# Patient Record
Sex: Female | Born: 1990 | Race: Black or African American | Hispanic: No | Marital: Single | Smoking: Current every day smoker
Health system: Southern US, Community
[De-identification: ages and names within clinical notes are randomized; demographics above are authoritative.]

## PROBLEM LIST (undated history)

## (undated) DIAGNOSIS — J45909 Unspecified asthma, uncomplicated: Secondary | ICD-10-CM

## (undated) DIAGNOSIS — J302 Other seasonal allergic rhinitis: Secondary | ICD-10-CM

## (undated) HISTORY — DX: Unspecified asthma, uncomplicated: J45.909

## (undated) HISTORY — DX: Other seasonal allergic rhinitis: J30.2

---
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
## 2008-09-19 ENCOUNTER — Emergency Department: Payer: Self-pay | Admitting: Emergency Medicine

## 2008-12-26 ENCOUNTER — Emergency Department: Payer: Self-pay | Admitting: Emergency Medicine

## 2009-09-26 ENCOUNTER — Emergency Department: Payer: Self-pay | Admitting: Emergency Medicine

## 2010-11-12 ENCOUNTER — Ambulatory Visit: Payer: Self-pay | Admitting: Advanced Practice Midwife

## 2011-02-10 ENCOUNTER — Inpatient Hospital Stay: Payer: Self-pay | Admitting: Obstetrics and Gynecology

## 2011-02-12 ENCOUNTER — Ambulatory Visit: Payer: Self-pay | Admitting: Obstetrics and Gynecology

## 2011-02-18 ENCOUNTER — Observation Stay: Payer: Self-pay

## 2011-03-18 ENCOUNTER — Observation Stay: Payer: Self-pay | Admitting: Obstetrics & Gynecology

## 2011-04-01 ENCOUNTER — Inpatient Hospital Stay: Payer: Self-pay | Admitting: Obstetrics and Gynecology

## 2011-08-03 ENCOUNTER — Emergency Department: Payer: Self-pay | Admitting: Emergency Medicine

## 2011-08-04 ENCOUNTER — Emergency Department: Payer: Self-pay | Admitting: Emergency Medicine

## 2011-11-27 ENCOUNTER — Emergency Department: Payer: Self-pay | Admitting: Emergency Medicine

## 2011-11-27 LAB — COMPREHENSIVE METABOLIC PANEL
Albumin: 3.8 g/dL (ref 3.4–5.0)
Anion Gap: 10 (ref 7–16)
BUN: 8 mg/dL (ref 7–18)
Bilirubin,Total: 0.4 mg/dL (ref 0.2–1.0)
Calcium, Total: 9.5 mg/dL (ref 8.5–10.1)
Chloride: 103 mmol/L (ref 98–107)
Creatinine: 0.6 mg/dL (ref 0.60–1.30)
EGFR (African American): >60
Glucose: 89 mg/dL (ref 65–99)
Osmolality: 270 (ref 275–301)
Potassium: 4.2 mmol/L (ref 3.5–5.1)
SGOT(AST): 20 U/L (ref 15–37)
Total Protein: 8.7 g/dL — ABNORMAL HIGH (ref 6.4–8.2)

## 2011-11-27 LAB — URINALYSIS, COMPLETE
Blood: NEGATIVE
Ketone: NEGATIVE
Ph: 5 (ref 4.5–8.0)
Protein: NEGATIVE
RBC,UR: 1 /HPF (ref 0–5)
Specific Gravity: 1.027 (ref 1.003–1.030)
Squamous Epithelial: 15
WBC UR: 8 /HPF (ref 0–5)

## 2011-11-27 LAB — CBC
HCT: 35.9 % (ref 35.0–47.0)
HGB: 11.9 g/dL — ABNORMAL LOW (ref 12.0–16.0)
Platelet: 323 10*3/uL (ref 150–440)
RBC: 4.12 10*6/uL (ref 3.80–5.20)
RDW: 13.8 % (ref 11.5–14.5)
WBC: 7.5 10*3/uL (ref 3.6–11.0)

## 2011-11-27 LAB — LIPASE, BLOOD: Lipase: 65 U/L — ABNORMAL LOW (ref 73–393)

## 2011-11-27 LAB — PREGNANCY, URINE: Pregnancy Test, Urine: NEGATIVE m[IU]/mL

## 2014-05-05 ENCOUNTER — Emergency Department: Payer: Self-pay | Admitting: Emergency Medicine

## 2014-05-05 LAB — COMPREHENSIVE METABOLIC PANEL
ALK PHOS: 74 U/L
Albumin: 3.8 g/dL (ref 3.4–5.0)
Anion Gap: 4 — ABNORMAL LOW (ref 7–16)
BUN: 11 mg/dL (ref 7–18)
Bilirubin,Total: 0.5 mg/dL (ref 0.2–1.0)
CHLORIDE: 109 mmol/L — AB (ref 98–107)
Calcium, Total: 8.9 mg/dL (ref 8.5–10.1)
Co2: 24 mmol/L (ref 21–32)
Creatinine: 0.81 mg/dL (ref 0.60–1.30)
EGFR (Non-African Amer.): >60
Glucose: 98 mg/dL (ref 65–99)
OSMOLALITY: 273 (ref 275–301)
Potassium: 3.9 mmol/L (ref 3.5–5.1)
SGOT(AST): 15 U/L (ref 15–37)
SGPT (ALT): 20 U/L (ref 12–78)
SODIUM: 137 mmol/L (ref 136–145)
Total Protein: 7.7 g/dL (ref 6.4–8.2)

## 2014-05-05 LAB — URINALYSIS, COMPLETE
BILIRUBIN, UR: NEGATIVE
Bacteria: NONE SEEN
Glucose,UR: NEGATIVE mg/dL (ref 0–75)
Ketone: NEGATIVE
NITRITE: NEGATIVE
PH: 6 (ref 4.5–8.0)
Protein: 30
RBC,UR: 1783 /HPF (ref 0–5)
SPECIFIC GRAVITY: 1.026 (ref 1.003–1.030)

## 2014-05-05 LAB — CBC
HCT: 38.2 % (ref 35.0–47.0)
HGB: 12.4 g/dL (ref 12.0–16.0)
MCH: 29.4 pg (ref 26.0–34.0)
MCHC: 32.6 g/dL (ref 32.0–36.0)
MCV: 90 fL (ref 80–100)
Platelet: 237 10*3/uL (ref 150–440)
RBC: 4.22 10*6/uL (ref 3.80–5.20)
RDW: 14.1 % (ref 11.5–14.5)
WBC: 10.6 10*3/uL (ref 3.6–11.0)

## 2014-05-05 LAB — WET PREP, GENITAL

## 2014-05-05 LAB — PREGNANCY, URINE: PREGNANCY TEST, URINE: NEGATIVE m[IU]/mL

## 2014-05-05 LAB — GC/CHLAMYDIA PROBE AMP

## 2014-10-06 ENCOUNTER — Emergency Department (HOSPITAL_COMMUNITY)
Admission: EM | Admit: 2014-10-06 | Discharge: 2014-10-06 | Disposition: A | Discharge status: Home / Self Care | Payer: Medicaid Other | Attending: Emergency Medicine | Admitting: Emergency Medicine

## 2014-10-06 ENCOUNTER — Encounter (HOSPITAL_COMMUNITY): Payer: Self-pay | Admitting: Emergency Medicine

## 2014-10-06 DIAGNOSIS — N730 Acute parametritis and pelvic cellulitis: Secondary | ICD-10-CM

## 2014-10-06 DIAGNOSIS — Z720 Tobacco use: Secondary | ICD-10-CM | POA: Insufficient documentation

## 2014-10-06 DIAGNOSIS — Z3202 Encounter for pregnancy test, result negative: Secondary | ICD-10-CM | POA: Insufficient documentation

## 2014-10-06 DIAGNOSIS — N739 Female pelvic inflammatory disease, unspecified: Secondary | ICD-10-CM | POA: Diagnosis not present

## 2014-10-06 DIAGNOSIS — R1031 Right lower quadrant pain: Secondary | ICD-10-CM | POA: Diagnosis present

## 2014-10-06 LAB — URINE MICROSCOPIC-ADD ON

## 2014-10-06 LAB — COMPREHENSIVE METABOLIC PANEL
ALK PHOS: 79 U/L (ref 39–117)
ALT: 11 U/L (ref 0–35)
AST: 15 U/L (ref 0–37)
Albumin: 3.1 g/dL — ABNORMAL LOW (ref 3.5–5.2)
Anion gap: 15 (ref 5–15)
BILIRUBIN TOTAL: 0.2 mg/dL — AB (ref 0.3–1.2)
BUN: 10 mg/dL (ref 6–23)
CHLORIDE: 99 meq/L (ref 96–112)
CO2: 22 mEq/L (ref 19–32)
Calcium: 9.7 mg/dL (ref 8.4–10.5)
Creatinine, Ser: 0.64 mg/dL (ref 0.50–1.10)
GFR calc Af Amer: >90 mL/min (ref >90)
GFR calc non Af Amer: >90 mL/min (ref >90)
Glucose, Bld: 96 mg/dL (ref 70–99)
POTASSIUM: 4 meq/L (ref 3.7–5.3)
Sodium: 136 mEq/L — ABNORMAL LOW (ref 137–147)
Total Protein: 8.2 g/dL (ref 6.0–8.3)

## 2014-10-06 LAB — URINALYSIS, ROUTINE W REFLEX MICROSCOPIC
BILIRUBIN URINE: NEGATIVE
Glucose, UA: NEGATIVE mg/dL
Ketones, ur: NEGATIVE mg/dL
Nitrite: NEGATIVE
Protein, ur: NEGATIVE mg/dL
SPECIFIC GRAVITY, URINE: 1.027 (ref 1.005–1.030)
Urobilinogen, UA: 1 mg/dL (ref 0.0–1.0)
pH: 7.5 (ref 5.0–8.0)

## 2014-10-06 LAB — CBC WITH DIFFERENTIAL/PLATELET
Basophils Absolute: 0 10*3/uL (ref 0.0–0.1)
Basophils Relative: 0 % (ref 0–1)
Eosinophils Absolute: 0.2 10*3/uL (ref 0.0–0.7)
Eosinophils Relative: 2 % (ref 0–5)
HCT: 33.1 % — ABNORMAL LOW (ref 36.0–46.0)
HEMOGLOBIN: 10.7 g/dL — AB (ref 12.0–15.0)
LYMPHS PCT: 24 % (ref 12–46)
Lymphs Abs: 3 10*3/uL (ref 0.7–4.0)
MCH: 28.7 pg (ref 26.0–34.0)
MCHC: 32.3 g/dL (ref 30.0–36.0)
MCV: 88.7 fL (ref 78.0–100.0)
MONOS PCT: 6 % (ref 3–12)
Monocytes Absolute: 0.7 10*3/uL (ref 0.1–1.0)
NEUTROS ABS: 8.5 10*3/uL — AB (ref 1.7–7.7)
Neutrophils Relative %: 68 % (ref 43–77)
Platelets: 415 10*3/uL — ABNORMAL HIGH (ref 150–400)
RBC: 3.73 MIL/uL — ABNORMAL LOW (ref 3.87–5.11)
RDW: 13.5 % (ref 11.5–15.5)
WBC: 12.5 10*3/uL — AB (ref 4.0–10.5)

## 2014-10-06 LAB — WET PREP, GENITAL
Clue Cells Wet Prep HPF POC: NONE SEEN
Yeast Wet Prep HPF POC: NONE SEEN

## 2014-10-06 LAB — HIV ANTIBODY (ROUTINE TESTING W REFLEX): HIV 1&2 Ab, 4th Generation: NONREACTIVE

## 2014-10-06 LAB — PREGNANCY, URINE: Preg Test, Ur: NEGATIVE

## 2014-10-06 MED ORDER — LIDOCAINE HCL 2 % IJ SOLN
INTRAMUSCULAR | Status: AC
  Administered 2014-10-06: 400 mg
  Filled 2014-10-06: qty 20

## 2014-10-06 MED ORDER — DOXYCYCLINE HYCLATE 100 MG PO TABS
100.0000 mg | ORAL_TABLET | Freq: Once | ORAL | Status: AC
  Administered 2014-10-06: 100 mg via ORAL
  Filled 2014-10-06: qty 1

## 2014-10-06 MED ORDER — CEFTRIAXONE SODIUM 250 MG IJ SOLR
250.0000 mg | Freq: Once | INTRAMUSCULAR | Status: AC
  Administered 2014-10-06: 250 mg via INTRAMUSCULAR
  Filled 2014-10-06: qty 250

## 2014-10-06 MED ORDER — DOXYCYCLINE HYCLATE 100 MG PO TABS: 100.0000 mg | ORAL_TABLET | Freq: Two times a day (BID) | ORAL | Status: DC

## 2014-10-06 NOTE — Discharge Instructions (Signed)
Pelvic Inflammatory Disease Jasmin Moore, you were seen today for abdominal pain. Take antibiotics as prescribed to clear the infection. Follow-up with a primary care physician within 3 days for continued management. If your symptoms worsen come back to emergency department immediately for repeat evaluation. Thank you. Pelvic inflammatory disease (PID) is an infection in some or all of the female organs. PID can be in the uterus, ovaries, fallopian tubes, or the surrounding tissues inside the lower belly area (pelvis). HOME CARE   If given, take your antibiotic medicine as told. Finish them even if you start to feel better.  Only take medicine as told by your doctor.  Do not have sex (intercourse) until treatment is done or as told by your doctor.  Tell your sex partner if you have PID. Your partner may need to be treated.  Keep all doctor visits. GET HELP RIGHT AWAY IF:   You have a fever.  You have more belly (abdominal) or lower belly pain.  You have chills.  You have pain when you pee (urinate).  You are not better after 72 hours.  You have more fluid (discharge) coming from your vagina or fluid that is not normal.  You need pain medicine from your doctor.  You throw up (vomit).  You cannot take your medicines.  Your partner has a sexually transmitted disease (STD). MAKE SURE YOU:   Understand these instructions.  Will watch your condition.  Will get help right away if you are not doing well or get worse. Document Released: 01/02/2009 Document Revised: 01/31/2013 Document Reviewed: 10/02/2011 [facility] Patient Information 2015 [place], [place]. This information is not intended to replace advice given to you by your health care provider. Make sure you discuss any questions you have with your health care provider.

## 2014-10-06 NOTE — ED Notes (Signed)
Pt states she is having lower abd pain for the past 3 days  Pt states she has been very tired and sleeping a lot  Pt states she has body aches  Pt states it hurts to urinate and she has been having vaginal bleeding off and on   Pt states she wants to be checked for an STD while she is here  Pt states she is unsure when her last normal period was

## 2014-10-06 NOTE — ED Notes (Signed)
Pt complains of lower abdominal pain for several days, she is requesting a pelvic exam, she states that she has constant bleeding throughout the month.
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
## 2014-10-07 LAB — GC/CHLAMYDIA PROBE AMP
CT Probe RNA: NEGATIVE
GC Probe RNA: POSITIVE — AB

## 2014-10-08 ENCOUNTER — Telehealth: Payer: Self-pay | Admitting: Emergency Medicine

## 2014-10-08 NOTE — Telephone Encounter (Signed)
Positive Gonorrhea culture Chart sent to EDP for review 

## 2014-10-10 ENCOUNTER — Telehealth (HOSPITAL_BASED_OUTPATIENT_CLINIC_OR_DEPARTMENT_OTHER): Payer: Self-pay | Admitting: *Deleted

## 2014-12-05 NOTE — ED Notes (Signed)
This Charge RN attempted to call the Pt again w/o a response or the ability to leave a message.  Voice message left for STD RN.

## 2014-12-05 NOTE — ED Notes (Signed)
This Consulting civil engineerCharge RN received a call, from [facility], stating that the Pt was diagnosed w/ gonorrhea.  Sts should have received azithromycin, per CDC protocol.  RN asked that we notify the Pt and provide a prescription.  This Consulting civil engineerCharge RN has tried to call Pt x 4 w/o an answer.  [facility] STD RN- Wendi SnipesMeteea Garner  [phone] Phone                                                                                [phone] Fax

## 2015-01-29 ENCOUNTER — Telehealth (HOSPITAL_COMMUNITY): Payer: Self-pay

## 2015-07-27 ENCOUNTER — Encounter: Payer: Self-pay | Admitting: Emergency Medicine

## 2015-07-27 ENCOUNTER — Emergency Department
Admission: EM | Admit: 2015-07-27 | Discharge: 2015-07-27 | Disposition: A | Discharge status: Home / Self Care | Payer: Medicaid Other | Attending: Emergency Medicine | Admitting: Emergency Medicine

## 2015-07-27 DIAGNOSIS — R300 Dysuria: Secondary | ICD-10-CM | POA: Diagnosis present

## 2015-07-27 DIAGNOSIS — Z720 Tobacco use: Secondary | ICD-10-CM | POA: Insufficient documentation

## 2015-07-27 DIAGNOSIS — Z3202 Encounter for pregnancy test, result negative: Secondary | ICD-10-CM | POA: Insufficient documentation

## 2015-07-27 DIAGNOSIS — N3000 Acute cystitis without hematuria: Secondary | ICD-10-CM | POA: Insufficient documentation

## 2015-07-27 DIAGNOSIS — Z792 Long term (current) use of antibiotics: Secondary | ICD-10-CM | POA: Insufficient documentation

## 2015-07-27 LAB — COMPREHENSIVE METABOLIC PANEL
ALK PHOS: 54 U/L (ref 38–126)
ALT: 11 U/L — ABNORMAL LOW (ref 14–54)
ANION GAP: 5 (ref 5–15)
AST: 16 U/L (ref 15–41)
Albumin: 4.4 g/dL (ref 3.5–5.0)
BUN: 8 mg/dL (ref 6–20)
CALCIUM: 9.3 mg/dL (ref 8.9–10.3)
CO2: 25 mmol/L (ref 22–32)
Chloride: 106 mmol/L (ref 101–111)
Creatinine, Ser: 0.61 mg/dL (ref 0.44–1.00)
GLUCOSE: 88 mg/dL (ref 65–99)
Potassium: 3.6 mmol/L (ref 3.5–5.1)
Sodium: 136 mmol/L (ref 135–145)
TOTAL PROTEIN: 7.6 g/dL (ref 6.5–8.1)
Total Bilirubin: 0.8 mg/dL (ref 0.3–1.2)

## 2015-07-27 LAB — CBC
HCT: 35.2 % (ref 35.0–47.0)
Hemoglobin: 11.7 g/dL — ABNORMAL LOW (ref 12.0–16.0)
MCH: 29.5 pg (ref 26.0–34.0)
MCHC: 33.2 g/dL (ref 32.0–36.0)
MCV: 88.7 fL (ref 80.0–100.0)
Platelets: 304 10*3/uL (ref 150–440)
RBC: 3.96 MIL/uL (ref 3.80–5.20)
RDW: 13.4 % (ref 11.5–14.5)
WBC: 9.7 10*3/uL (ref 3.6–11.0)

## 2015-07-27 LAB — URINALYSIS COMPLETE WITH MICROSCOPIC (ARMC ONLY)
Bilirubin Urine: NEGATIVE
GLUCOSE, UA: NEGATIVE mg/dL
Hgb urine dipstick: NEGATIVE
Ketones, ur: NEGATIVE mg/dL
Nitrite: NEGATIVE
Protein, ur: NEGATIVE mg/dL
SPECIFIC GRAVITY, URINE: 1.015 (ref 1.005–1.030)
Squamous Epithelial / LPF: NONE SEEN
pH: 5 (ref 5.0–8.0)

## 2015-07-27 LAB — POCT PREGNANCY, URINE: PREG TEST UR: NEGATIVE

## 2015-07-27 LAB — LIPASE, BLOOD: Lipase: 19 U/L — ABNORMAL LOW (ref 22–51)

## 2015-07-27 MED ORDER — SULFAMETHOXAZOLE-TRIMETHOPRIM 800-160 MG PO TABS: 1.0000 | ORAL_TABLET | Freq: Two times a day (BID) | ORAL | Status: AC

## 2015-07-27 NOTE — ED Notes (Signed)
Pt reports lower back pain, reports taking vicodin with no relief. Pt reports syncopal episode in shower, reports falling on tail bone, denies LOC or hitting head. Pt also reports dark urine color and decreased urination.

## 2015-07-27 NOTE — ED Notes (Signed)
AAOx3.  Skin warm and dry.  Ambulates with easy and steady gait. NAD 

## 2015-07-27 NOTE — ED Provider Notes (Signed)
[facility] Emergency Department Provider Note ____________________________________________  Time seen: Approximately 9:50 PM  I have reviewed the triage vital signs and the nursing notes.   HISTORY  Chief Complaint Back Pain and Dysuria   HPI Jasmin Moore is a 24 y.o. female who presents to the emergency department for evaluation of back pain x 1 week. She denies syncope when asked directly about the incident in the shower. She states that she just hit her tailbone. She also complains of dark urine and frequent urination that has not been relieved with vicodin.  History reviewed. No pertinent past medical history.  There are no active problems to display for this patient.   History reviewed. No pertinent past surgical history.  Current Outpatient Rx  Name  Moore  Sig  Dispense  Refill  . doxycycline (VIBRA-TABS) 100 MG tablet   Oral   Take 1 tablet (100 mg total) by mouth 2 (two) times daily.   28 tablet   0   . sulfamethoxazole-trimethoprim (BACTRIM DS,SEPTRA DS) 800-160 MG tablet   Oral   Take 1 tablet by mouth 2 (two) times daily.   6 tablet   0     Allergies Review of patient's allergies indicates no known allergies.  Family History  Problem Relation Age of Onset  . Heart failure Mother   . COPD Mother     Social History Social History  Substance Use Topics  . Smoking status: Current Every Day Smoker  . Smokeless tobacco: None  . Alcohol Use: Yes    Review of Systems Constitutional: No fever/chills Cardiovascular: Denies chest pain. Respiratory: Denies shortness of breath or cough. Gastrointestinal: Abdominal pain no., nausea no, vomitingno. Genitourinary: Dysuria no, vaginal discharge no.. Musculoskeletal: Negative for back pain. Skin: Negative for rash. Neurological: Negative for headaches, focal weakness or numbness.  10-point ROS otherwise negative.  ____________________________________________   PHYSICAL  EXAM:  VITAL SIGNS: ED Triage Vitals  Enc Vitals Group     BP 07/27/15 1805 115/68 mmHg     Pulse Rate 07/27/15 1805 96     Resp 07/27/15 1805 16     Temp 07/27/15 1805 98.3 F (36.8 C)     Temp Source 07/27/15 1805 Oral     SpO2 07/27/15 1805 100 %     Weight 07/27/15 1805 110 lb (49.896 kg)     Height 07/27/15 1805  (1.499 m)     Head Cir --      Peak Flow --      Pain Score 07/27/15 1805 10     Pain Loc --      Pain Edu? --      Excl. in GC? --     Constitutional: Alert and oriented. Well appearing and in no acute distress. Eyes: Conjunctivae are normal. PERRL. EOMI. Head: Atraumatic. Nose: No congestion/rhinnorhea. Mouth/Throat: Mucous membranes are moist.  Oropharynx non-erythematous. Neck: No stridor. Cardiovascular: Good peripheral circulation. Respiratory: Normal respiratory effort.  No retractions. Gastrointestinal: Soft and nontender. No distention. No abdominal bruits. Genitourinary: Pelvic exam: Not indicated Musculoskeletal: No extremity tenderness nor edema.  Neurologic:  Normal speech and language. No gross focal neurologic deficits are appreciated. Speech is normal. No gait instability. Skin:  Skin is warm, dry and intact. No rash noted. Psychiatric: Mood and affect are normal. Speech and behavior are normal.  ____________________________________________   LABS (all labs ordered are listed, but only abnormal results are displayed)  Labs Reviewed  LIPASE, BLOOD - Abnormal; Notable for the following:  Lipase 19 (*)    All other components within normal limits  COMPREHENSIVE METABOLIC PANEL - Abnormal; Notable for the following:    ALT 11 (*)    All other components within normal limits  CBC - Abnormal; Notable for the following:    Hemoglobin 11.7 (*)    All other components within normal limits  URINALYSIS COMPLETEWITH MICROSCOPIC ([facility] ONLY) - Abnormal; Notable for the following:    Color, Urine AMBER (*)    APPearance CLEAR (*)     Leukocytes, UA 1+ (*)    Bacteria, UA FEW (*)    All other components within normal limits  POC URINE PREG, ED  POCT PREGNANCY, URINE   ____________________________________________  RADIOLOGY   ____________________________________________   PROCEDURES  Procedure(s) performed: None  ____________________________________________   INITIAL IMPRESSION / ASSESSMENT AND PLAN / ED COURSE  Pertinent labs & imaging results that were available during my care of the patient were reviewed by me and considered in my medical decision making (see chart for details).  Patient was advised to follow up with the primary care provider for symptoms that are not improving over the next 3 days. She was advised to return to the emergency department for symptoms that change or worsen if unable to schedule an appointment with the primary care provider or specialist. ____________________________________________   FINAL CLINICAL IMPRESSION(S) / ED DIAGNOSES  Final diagnoses:  Acute cystitis without hematuria       Chinita Pester, FNP 07/27/15 2158  Minna Antis, MD 07/27/15 2318

## 2015-07-27 NOTE — Discharge Instructions (Signed)

## 2015-12-20 ENCOUNTER — Encounter: Payer: Self-pay | Admitting: *Deleted

## 2015-12-20 ENCOUNTER — Ambulatory Visit
Admission: EM | Admit: 2015-12-20 | Discharge: 2015-12-20 | Disposition: A | Discharge status: Home / Self Care | Payer: Medicaid Other | Attending: Family Medicine | Admitting: Family Medicine

## 2015-12-20 ENCOUNTER — Ambulatory Visit: Payer: Medicaid Other

## 2015-12-20 DIAGNOSIS — M545 Low back pain, unspecified: Secondary | ICD-10-CM

## 2015-12-20 DIAGNOSIS — M62830 Muscle spasm of back: Secondary | ICD-10-CM | POA: Diagnosis not present

## 2015-12-20 LAB — URINALYSIS COMPLETE WITH MICROSCOPIC (ARMC ONLY)
BILIRUBIN URINE: NEGATIVE
Glucose, UA: NEGATIVE mg/dL
HGB URINE DIPSTICK: NEGATIVE
Ketones, ur: NEGATIVE mg/dL
Nitrite: NEGATIVE
PH: 5.5 (ref 5.0–8.0)
PROTEIN: NEGATIVE mg/dL
Specific Gravity, Urine: >1.03 — ABNORMAL HIGH (ref 1.005–1.030)

## 2015-12-20 LAB — PREGNANCY, URINE: Preg Test, Ur: NEGATIVE

## 2015-12-20 MED ORDER — TRAMADOL HCL 50 MG PO TABS: 50.0000 mg | ORAL_TABLET | Freq: Four times a day (QID) | ORAL | Status: AC | PRN

## 2015-12-20 MED ORDER — MELOXICAM 7.5 MG PO TABS: 7.5000 mg | ORAL_TABLET | Freq: Every day | ORAL | Status: AC

## 2015-12-20 MED ORDER — TIZANIDINE HCL 4 MG PO TABS: 4.0000 mg | ORAL_TABLET | Freq: Four times a day (QID) | ORAL | Status: AC | PRN

## 2015-12-20 MED ORDER — KETOROLAC TROMETHAMINE 60 MG/2ML IM SOLN
60.0000 mg | Freq: Once | INTRAMUSCULAR | Status: AC
  Administered 2015-12-20: 60 mg via INTRAMUSCULAR
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
## 2015-12-20 NOTE — Discharge Instructions (Signed)
Back Pain, Adult Back pain is very common. The pain often gets better over time. The cause of back pain is usually not dangerous. Most people can learn to manage their back pain on their own.  HOME CARE  Watch your back pain for any changes. The following actions may help to lessen any pain you are feeling:  Stay active. Start with short walks on flat ground if you can. Try to walk farther each day.  Exercise regularly as told by your doctor. Exercise helps your back heal faster. It also helps avoid future injury by keeping your muscles strong and flexible.  Do not sit, drive, or stand in one place for more than 30 minutes.  Do not stay in bed. Resting more than 1-2 days can slow down your recovery.  Be careful when you bend or lift an object. Use good form when lifting:  Bend at your knees.  Keep the object close to your body.  Do not twist.  Sleep on a firm mattress. Lie on your side, and bend your knees. If you lie on your back, put a pillow under your knees.  Take medicines only as told by your doctor.  Put ice on the injured area.  Put ice in a plastic bag.  Place a towel between your skin and the bag.  Leave the ice on for 20 minutes, 2-3 times a day for the first 2-3 days. After that, you can switch between ice and heat packs.  Avoid feeling anxious or stressed. Find good ways to deal with stress, such as exercise.  Maintain a healthy weight. Extra weight puts stress on your back. GET HELP IF:   You have pain that does not go away with rest or medicine.  You have worsening pain that goes down into your legs or buttocks.  You have pain that does not get better in one week.  You have pain at night.  You lose weight.  You have a fever or chills. GET HELP RIGHT AWAY IF:   You cannot control when you poop (bowel movement) or pee (urinate).  Your arms or legs feel weak.  Your arms or legs lose feeling (numbness).  You feel sick to your stomach (nauseous) or  throw up (vomit).  You have belly (abdominal) pain.  You feel like you may pass out (faint).   This information is not intended to replace advice given to you by your health care provider. Make sure you discuss any questions you have with your health care provider.   Document Released: 03/24/2008 Document Revised: 10/27/2014 Document Reviewed: 02/07/2014 Elsevier Interactive Patient Education 2016 [employer].  Tourist information centre manager After a car crash (motor vehicle collision), it is normal to have bruises and sore muscles. The first 24 hours usually feel the worst. After that, you will likely start to feel better each day. HOME CARE  Put ice on the injured area.  Put ice in a plastic bag.  Place a towel between your skin and the bag.  Leave the ice on for 15-20 minutes, 03-04 times a day.  Drink enough fluids to keep your pee (urine) clear or pale yellow.  Do not drink alcohol.  Take a warm shower or bath 1 or 2 times a day. This helps your sore muscles.  Return to activities as told by your doctor. Be careful when lifting. Lifting can make neck or back pain worse.  Only take medicine as told by your doctor. Do not use aspirin. GET HELP  RIGHT AWAY IF:   Your arms or legs tingle, feel weak, or lose feeling (numbness).  You have headaches that do not get better with medicine.  You have neck pain, especially in the middle of the back of your neck.  You cannot control when you pee (urinate) or poop (bowel movement).  Pain is getting worse in any part of your body.  You are short of breath, dizzy, or pass out (faint).  You have chest pain.  You feel sick to your stomach (nauseous), throw up (vomit), or sweat.  You have belly (abdominal) pain that gets worse.  There is blood in your pee, poop, or throw up.  You have pain in your shoulder (shoulder strap areas).  Your problems are getting worse. MAKE SURE YOU:   Understand these instructions.  Will watch your  condition.  Will get help right away if you are not doing well or get worse.   This information is not intended to replace advice given to you by your health care provider. Make sure you discuss any questions you have with your health care provider.   Document Released: 03/24/2008 Document Revised: 12/29/2011 Document Reviewed: 03/05/2011 Elsevier Interactive Patient Education 2016 [employer]  Muscle Cramps and Spasms Muscle cramps and spasms are when muscles tighten by themselves. They usually get better within minutes. Muscle cramps are painful. They are usually stronger and last longer than muscle spasms. Muscle spasms may or may not be painful. They can last a few seconds or much longer. HOME CARE  Drink enough fluid to keep your pee (urine) clear or pale yellow.  Massage, stretch, and relax the muscle.  Use a warm towel, heating pad, or warm shower water on tight muscles.  Place ice on the muscle if it is tender or in pain.  Put ice in a plastic bag.  Place a towel between your skin and the bag.  Leave the ice on for 15-20 minutes, 03-04 times a day.  Only take medicine as told by your doctor. GET HELP RIGHT AWAY IF:  Your cramps or spasms get worse, happen more often, or do not get better with time. MAKE SURE YOU:  Understand these instructions.  Will watch your condition.  Will get help right away if you are not doing well or get worse.   This information is not intended to replace advice given to you by your health care provider. Make sure you discuss any questions you have with your health care provider.   Document Released: 09/18/2008 Document Revised: 01/31/2013 Document Reviewed: 09/22/2012 Elsevier Interactive Patient Education [employer]

## 2015-12-22 LAB — URINE CULTURE: SPECIAL REQUESTS: NORMAL

## 2016-02-14 ENCOUNTER — Ambulatory Visit: Payer: Medicaid Other | Attending: Physician Assistant | Admitting: Physical Therapy

## 2016-02-18 ENCOUNTER — Ambulatory Visit: Payer: Medicaid Other | Admitting: Physical Therapy

## 2016-02-21 ENCOUNTER — Encounter: Payer: Medicaid Other | Admitting: Physical Therapy

## 2016-02-26 ENCOUNTER — Encounter: Payer: Medicaid Other | Admitting: Physical Therapy

## 2016-02-28 ENCOUNTER — Encounter: Payer: Medicaid Other | Admitting: Physical Therapy

## 2016-03-11 ENCOUNTER — Emergency Department
Admission: EM | Admit: 2016-03-11 | Discharge: 2016-03-11 | Disposition: A | Discharge status: Home / Self Care | Payer: Medicaid Other | Attending: Emergency Medicine | Admitting: Emergency Medicine

## 2016-03-11 DIAGNOSIS — E119 Type 2 diabetes mellitus without complications: Secondary | ICD-10-CM | POA: Diagnosis not present

## 2016-03-11 DIAGNOSIS — F17200 Nicotine dependence, unspecified, uncomplicated: Secondary | ICD-10-CM | POA: Insufficient documentation

## 2016-03-11 DIAGNOSIS — R55 Syncope and collapse: Secondary | ICD-10-CM | POA: Insufficient documentation

## 2016-03-11 LAB — BASIC METABOLIC PANEL
Anion gap: 8 (ref 5–15)
BUN: 20 mg/dL (ref 6–20)
CALCIUM: 9.4 mg/dL (ref 8.9–10.3)
CO2: 23 mmol/L (ref 22–32)
CREATININE: 0.95 mg/dL (ref 0.44–1.00)
Chloride: 108 mmol/L (ref 101–111)
GFR calc Af Amer: >60 mL/min (ref >60)
GLUCOSE: 96 mg/dL (ref 65–99)
POTASSIUM: 3.8 mmol/L (ref 3.5–5.1)
SODIUM: 139 mmol/L (ref 135–145)

## 2016-03-11 LAB — URINALYSIS COMPLETE WITH MICROSCOPIC (ARMC ONLY)
BILIRUBIN URINE: NEGATIVE
Bacteria, UA: NONE SEEN
GLUCOSE, UA: NEGATIVE mg/dL
Hgb urine dipstick: NEGATIVE
KETONES UR: NEGATIVE mg/dL
NITRITE: NEGATIVE
Protein, ur: NEGATIVE mg/dL
Specific Gravity, Urine: 1.017 (ref 1.005–1.030)
Squamous Epithelial / LPF: NONE SEEN
pH: 5 (ref 5.0–8.0)

## 2016-03-11 LAB — CBC
HCT: 37 % (ref 35.0–47.0)
Hemoglobin: 12.5 g/dL (ref 12.0–16.0)
MCH: 29.7 pg (ref 26.0–34.0)
MCHC: 33.8 g/dL (ref 32.0–36.0)
MCV: 87.8 fL (ref 80.0–100.0)
PLATELETS: 301 10*3/uL (ref 150–440)
RBC: 4.21 MIL/uL (ref 3.80–5.20)
RDW: 12.8 % (ref 11.5–14.5)
WBC: 13.7 10*3/uL — AB (ref 3.6–11.0)

## 2016-03-11 LAB — PREGNANCY, URINE: Preg Test, Ur: NEGATIVE

## 2016-03-11 NOTE — ED Notes (Signed)
Pt reports she was at work last night when she started to "see stars and sweat." Pt reports s he has been having low blood pressure issues every other week.

## 2016-03-11 NOTE — ED Notes (Addendum)
Pt in with co dizziness while at work ,states has had cough runny nose for few days.  Pt states "I want everything checked, I want to know if I am a diabetic and check my iron too".  Explained to patient that these are tests done by pmd and we do not do physicals in the ED.

## 2016-03-11 NOTE — Discharge Instructions (Signed)
You have been seen today in the Emergency Department (ED)  for near syncope (almost passing out).  Your workup including labs and EKG show reassuring results.  Your symptoms may be due to dehydration, so it is important that you drink plenty of non-alcoholic fluids. ° °Please call your regular doctor as soon as possible to schedule the next available clinic appointment to follow up with him/her regarding your visit to the ED and your symptoms.  Return to the Emergency Department (ED)  if you have any further syncopal episodes (pass out again) or develop ANY chest pain, pressure, tightness, trouble breathing, sudden sweating, or other symptoms that concern you. ° ° °Near-Syncope °Near-syncope (commonly known as near fainting) is sudden weakness, dizziness, or feeling like you might pass out. During an episode of near-syncope, you may also develop pale skin, have tunnel vision, or feel sick to your stomach (nauseous). Near-syncope may occur when getting up after sitting or while standing for a long time. It is caused by a sudden decrease in blood flow to the brain. This decrease can result from various causes or triggers, most of which are not serious. However, because near-syncope can sometimes be a sign of something serious, a medical evaluation is required. The specific cause is often not determined. °HOME CARE INSTRUCTIONS  °Monitor your condition for any changes. The following actions may help to alleviate any discomfort you are experiencing: °· Have someone stay with you until you feel stable. °· Lie down right away and prop your feet up if you start feeling like you might faint. Breathe deeply and steadily. Wait until all the symptoms have passed. Most of these episodes last only a few minutes. You may feel tired for several hours.   °· Drink enough fluids to keep your urine clear or pale yellow.   °· If you are taking blood pressure or heart medicine, get up slowly when seated or lying down. Take several  minutes to sit and then stand. This can reduce dizziness. °· Follow up with your health care provider as directed.  °SEEK IMMEDIATE MEDICAL CARE IF:  °· You have a severe headache.   °· You have unusual pain in the chest, abdomen, or back.   °· You are bleeding from the mouth or rectum, or you have black or tarry stool.   °· You have an irregular or very fast heartbeat.   °· You have repeated fainting or have seizure-like jerking during an episode.   °· You faint when sitting or lying down.   °· You have confusion.   °· You have difficulty walking.   °· You have severe weakness.   °· You have vision problems.   °MAKE SURE YOU:  °· Understand these instructions. °· Will watch your condition. °· Will get help right away if you are not doing well or get worse. °  °This information is not intended to replace advice given to you by your health care provider. Make sure you discuss any questions you have with your health care provider. °  °Document Released: 10/06/2005 Document Revised: 10/11/2013 Document Reviewed: 03/11/2013 °Elsevier Interactive Patient Education ©2016 Elsevier Inc. ° °

## 2016-03-11 NOTE — ED Provider Notes (Signed)
[facility] Emergency Department Provider Note  ____________________________________________  Time seen: Approximately 6:06 AM  I have reviewed the triage vital signs and the nursing notes.   HISTORY  Chief Complaint Dizziness    HPI Jasmin Moore is a 25 y.o. female who reports a medical history of diabetes although it does not appear in her medical history who presents for a near syncopal episode at work.  She reports that her blood pressure has been intermittently low for several weeks.  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  She reports that she has also had some nasal congestion recently.  She was on the job when she became lightheaded and experienced "seeing some stars".  She thought she might pass out so she lied down on the floor.  She never lost consciousness and did not have a fall or sustain any injuries.  She denies fever/chills, chest pain, shortness of breath, nausea, vomiting, diarrhea, dysuria, vaginal bleeding.  She describes the episode as severe but resolved at this time.  She has been sleeping comfortably in the emergency department.  Her blood pressure is consistently been in the upper 90s to low 100s systolic.  She has a small frame and a normal body habitus.   No past medical history on file.  There are no active problems to display for this patient.   No past surgical history on file.  Current Outpatient Rx  Name  Route  Sig  Dispense  Refill  . doxycycline (VIBRA-TABS) 100 MG tablet   Oral   Take 1 tablet (100 mg total) by mouth 2 (two) times daily. Patient not taking: Reported on 03/11/2016   28 tablet   0   . meloxicam (MOBIC) 7.5 MG tablet   Oral   Take 1 tablet (7.5 mg total) by mouth daily. Not take Motrin or Aleve Patient not taking: Reported on 03/11/2016   30 tablet   0   . sulfamethoxazole-trimethoprim (BACTRIM DS,SEPTRA DS) 800-160 MG tablet   Oral   Take 1 tablet by mouth 2  (two) times daily. Patient not taking: Reported on 03/11/2016   6 tablet   0   . tiZANidine (ZANAFLEX) 4 MG tablet   Oral   Take 1 tablet (4 mg total) by mouth every 6 (six) hours as needed for muscle spasms. Patient not taking: Reported on 03/11/2016   30 tablet   0   . traMADol (ULTRAM) 50 MG tablet   Oral   Take 1 tablet (50 mg total) by mouth every 6 (six) hours as needed. Patient not taking: Reported on 03/11/2016   15 tablet   0     Allergies Review of patient's allergies indicates no known allergies.  Family History  Problem Relation Age of Onset  . Heart failure Mother   . COPD Mother     Social History Social History  Substance Use Topics  . Smoking status: Current Every Day Smoker  . Smokeless tobacco: Not on file  . Alcohol Use: Yes    Review of Systems Constitutional: No fever/chills Eyes: No visual changes. ENT: No sore throat.  Nasal congestion and runny nose Cardiovascular: Denies chest pain. Respiratory: Denies shortness of breath. Gastrointestinal: No abdominal pain.  No nausea, no vomiting.  No diarrhea.  No constipation. Genitourinary: Negative for dysuria. Musculoskeletal: Negative for back pain. Skin: Negative for rash. Neurological: Negative for headaches, focal weakness or numbness.  Near syncope.  10-point ROS  otherwise negative.  ____________________________________________   PHYSICAL EXAM:  VITAL SIGNS: ED Triage Vitals  Enc Vitals Group     BP 03/11/16 0148 100/77 mmHg     Pulse Rate 03/11/16 0148 92     Resp 03/11/16 0148 18     Temp 03/11/16 0148 97.7 F (36.5 C)     Temp Source 03/11/16 0148 Oral     SpO2 03/11/16 0148 97 %     Weight 03/11/16 0148 100 lb (45.36 kg)     Height 03/11/16 0148  (1.499 m)     Head Cir --      Peak Flow --      Pain Score --      Pain Loc --      Pain Edu? --      Excl. in GC? --     Constitutional: Alert and oriented. Well appearing and in no acute distress. Eyes: Conjunctivae  are normal. PERRL. EOMI. Head: Atraumatic. Nose: No congestion/rhinnorhea. Mouth/Throat: Mucous membranes are moist.  Oropharynx non-erythematous. Neck: No stridor.  No meningeal signs.   Cardiovascular: Normal rate, regular rhythm. Good peripheral circulation. Grossly normal heart sounds.   Respiratory: Normal respiratory effort.  No retractions. Lungs CTAB. Gastrointestinal: Soft and nontender. No distention.  Musculoskeletal: No lower extremity tenderness nor edema. No gross deformities of extremities. Neurologic:  Normal speech and language. No gross focal neurologic deficits are appreciated.  Skin:  Skin is warm, dry and intact. No rash noted. Psychiatric: Mood and affect are normal. Speech and behavior are normal.  ____________________________________________   LABS (all labs ordered are listed, but only abnormal results are displayed)  Labs Reviewed  CBC - Abnormal; Notable for the following:    WBC 13.7 (*)    All other components within normal limits  URINALYSIS COMPLETEWITH MICROSCOPIC ([facility] ONLY) - Abnormal; Notable for the following:    Color, Urine YELLOW (*)    APPearance CLEAR (*)    Leukocytes, UA TRACE (*)    All other components within normal limits  BASIC METABOLIC PANEL  PREGNANCY, URINE   ____________________________________________  EKG  ED ECG REPORT I, Tylar Merendino, the attending physician, personally viewed and interpreted this ECG.  Date: 03/11/2016 EKG Time: 06:55 Rate: 84 Rhythm: normal sinus rhythm QRS Axis: normal Intervals: normal ST/T Wave abnormalities: Non-specific ST segment / T-wave changes, but no evidence of acute ischemia. Conduction Disturbances: none Narrative Interpretation: unremarkable  ____________________________________________  RADIOLOGY   No results found.  ____________________________________________   PROCEDURES  Procedure(s) performed: None  Critical Care performed:  No ____________________________________________   INITIAL IMPRESSION / ASSESSMENT AND PLAN / ED COURSE  Pertinent labs & imaging results that were available during my care of the patient were reviewed by me and considered in my medical decision making (see chart for details).  Physical exam is reassuring as are the vital signs.  EKG is unremarkable and blood work is normal.  I gave the patient the reassuring results.  I encouraged her to drink plenty of by mouth fluids as she might be slightly volume depleted.  There is no indication for IV fluids at this point; she is able to tolerate by mouth intake just fine and is asymptomatic this point.  I encouraged her to follow up as an outpatient.  She understands and agrees with the plan.   I gave my usual and customary return precautions.      ____________________________________________  FINAL CLINICAL IMPRESSION(S) / ED DIAGNOSES  Final diagnoses:  Near syncope  MEDICATIONS GIVEN DURING THIS VISIT:  Medications - No data to display   NEW OUTPATIENT MEDICATIONS STARTED DURING THIS VISIT:  New Prescriptions   No medications on file      Note:  This document was prepared using Dragon voice recognition software and may include unintentional dictation errors.   Loleta Roseory Daneen Volcy, MD 03/11/16 [phone]

## 2017-08-14 ENCOUNTER — Encounter: Payer: Self-pay | Admitting: Emergency Medicine

## 2017-08-14 ENCOUNTER — Emergency Department
Admission: EM | Admit: 2017-08-14 | Discharge: 2017-08-14 | Disposition: A | Discharge status: Home / Self Care | Payer: Medicaid Other | Attending: Emergency Medicine | Admitting: Emergency Medicine

## 2017-08-14 DIAGNOSIS — Y9389 Activity, other specified: Secondary | ICD-10-CM | POA: Insufficient documentation

## 2017-08-14 DIAGNOSIS — Y999 Unspecified external cause status: Secondary | ICD-10-CM | POA: Insufficient documentation

## 2017-08-14 DIAGNOSIS — F10920 Alcohol use, unspecified with intoxication, uncomplicated: Secondary | ICD-10-CM | POA: Diagnosis not present

## 2017-08-14 DIAGNOSIS — F17210 Nicotine dependence, cigarettes, uncomplicated: Secondary | ICD-10-CM | POA: Diagnosis not present

## 2017-08-14 DIAGNOSIS — Z23 Encounter for immunization: Secondary | ICD-10-CM | POA: Diagnosis not present

## 2017-08-14 DIAGNOSIS — S0181XA Laceration without foreign body of other part of head, initial encounter: Secondary | ICD-10-CM | POA: Diagnosis present

## 2017-08-14 DIAGNOSIS — Y9289 Other specified places as the place of occurrence of the external cause: Secondary | ICD-10-CM | POA: Insufficient documentation

## 2017-08-14 DIAGNOSIS — Z79899 Other long term (current) drug therapy: Secondary | ICD-10-CM | POA: Insufficient documentation

## 2017-08-14 LAB — CBC WITH DIFFERENTIAL/PLATELET
Basophils Absolute: 0.1 10*3/uL (ref 0–0.1)
Basophils Relative: 1 %
EOS PCT: 1 %
Eosinophils Absolute: 0.1 10*3/uL (ref 0–0.7)
HEMATOCRIT: 36.4 % (ref 35.0–47.0)
HEMOGLOBIN: 12.4 g/dL (ref 12.0–16.0)
LYMPHS ABS: 2 10*3/uL (ref 1.0–3.6)
LYMPHS PCT: 20 %
MCH: 30.3 pg (ref 26.0–34.0)
MCHC: 34.2 g/dL (ref 32.0–36.0)
MCV: 88.5 fL (ref 80.0–100.0)
MONO ABS: 0.3 10*3/uL (ref 0.2–0.9)
MONOS PCT: 3 %
Neutro Abs: 7.4 10*3/uL — ABNORMAL HIGH (ref 1.4–6.5)
Neutrophils Relative %: 75 %
Platelets: 286 10*3/uL (ref 150–440)
RBC: 4.11 MIL/uL (ref 3.80–5.20)
RDW: 13.5 % (ref 11.5–14.5)
WBC: 9.9 10*3/uL (ref 3.6–11.0)

## 2017-08-14 LAB — BASIC METABOLIC PANEL
Anion gap: 9 (ref 5–15)
BUN: 13 mg/dL (ref 6–20)
CHLORIDE: 106 mmol/L (ref 101–111)
CO2: 21 mmol/L — AB (ref 22–32)
Calcium: 9 mg/dL (ref 8.9–10.3)
Creatinine, Ser: 0.73 mg/dL (ref 0.44–1.00)
GFR calc Af Amer: >60 mL/min (ref >60)
GFR calc non Af Amer: >60 mL/min (ref >60)
Glucose, Bld: 96 mg/dL (ref 65–99)
POTASSIUM: 3.8 mmol/L (ref 3.5–5.1)
SODIUM: 136 mmol/L (ref 135–145)

## 2017-08-14 LAB — ETHANOL: ALCOHOL ETHYL (B): 83 mg/dL — AB (ref <10)

## 2017-08-14 MED ORDER — SODIUM CHLORIDE 0.9 % IV BOLUS (SEPSIS)
1000.0000 mL | Freq: Once | INTRAVENOUS | Status: AC
  Administered 2017-08-14: 1000 mL via INTRAVENOUS

## 2017-08-14 MED ORDER — CEPHALEXIN 500 MG PO CAPS: 500.0000 mg | ORAL_CAPSULE | Freq: Three times a day (TID) | ORAL | 0 refills | Status: AC

## 2017-08-14 MED ORDER — CEFAZOLIN SODIUM-DEXTROSE 1-4 GM/50ML-% IV SOLN
1.0000 g | Freq: Once | INTRAVENOUS | Status: AC
  Administered 2017-08-14: 1 g via INTRAVENOUS
  Filled 2017-08-14: qty 50

## 2017-08-14 MED ORDER — TETANUS-DIPHTH-ACELL PERTUSSIS 5-2.5-18.5 LF-MCG/0.5 IM SUSP
0.5000 mL | Freq: Once | INTRAMUSCULAR | Status: AC
  Administered 2017-08-14: 0.5 mL via INTRAMUSCULAR
  Filled 2017-08-14: qty 0.5

## 2017-08-14 MED ORDER — LIDOCAINE HCL (PF) 1 % IJ SOLN
INTRAMUSCULAR | Status: AC
  Filled 2017-08-14: qty 5
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
## 2017-08-14 NOTE — Discharge Instructions (Addendum)
1. Suture removal in 5 days. 2. Take antibiotic as prescribed (Keflex 500 mg 3 times daily 7 days). 3. Your tetanus has been updated and will be good for the next 10 years. 4. Return to the ER for worsening symptoms, increased redness/swelling, purulent discharge, persistent vomiting, difficulty breathing or other concerns.
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
## 2019-08-08 ENCOUNTER — Other Ambulatory Visit: Payer: Self-pay

## 2019-08-08 ENCOUNTER — Encounter: Payer: Self-pay | Admitting: Emergency Medicine

## 2019-08-08 DIAGNOSIS — N739 Female pelvic inflammatory disease, unspecified: Secondary | ICD-10-CM | POA: Insufficient documentation

## 2019-08-08 DIAGNOSIS — F17200 Nicotine dependence, unspecified, uncomplicated: Secondary | ICD-10-CM | POA: Diagnosis not present

## 2019-08-08 DIAGNOSIS — R102 Pelvic and perineal pain: Secondary | ICD-10-CM | POA: Insufficient documentation

## 2019-08-08 DIAGNOSIS — R1030 Lower abdominal pain, unspecified: Secondary | ICD-10-CM | POA: Diagnosis present

## 2019-08-08 NOTE — ED Triage Notes (Signed)
Patient ambulatory to triage with steady gait, without difficulty or distress noted, mask in place; pt reports x 2 days having generalized abd pain "like contractions" with no accomp symptoms; denies hx of same

## 2019-08-09 ENCOUNTER — Encounter: Payer: Self-pay | Admitting: Radiology

## 2019-08-09 ENCOUNTER — Emergency Department
Admission: EM | Admit: 2019-08-09 | Discharge: 2019-08-09 | Disposition: A | Discharge status: Home / Self Care | Payer: Medicaid Other | Attending: Emergency Medicine | Admitting: Emergency Medicine

## 2019-08-09 ENCOUNTER — Emergency Department: Payer: Medicaid Other

## 2019-08-09 DIAGNOSIS — N730 Acute parametritis and pelvic cellulitis: Secondary | ICD-10-CM

## 2019-08-09 DIAGNOSIS — R102 Pelvic and perineal pain: Secondary | ICD-10-CM

## 2019-08-09 DIAGNOSIS — R1030 Lower abdominal pain, unspecified: Secondary | ICD-10-CM

## 2019-08-09 LAB — URINALYSIS, COMPLETE (UACMP) WITH MICROSCOPIC
Bacteria, UA: NONE SEEN
Bilirubin Urine: NEGATIVE
Glucose, UA: NEGATIVE mg/dL
Hgb urine dipstick: NEGATIVE
Ketones, ur: 20 mg/dL — AB
Nitrite: NEGATIVE
Protein, ur: NEGATIVE mg/dL
Specific Gravity, Urine: 1.029 (ref 1.005–1.030)
pH: 6 (ref 5.0–8.0)

## 2019-08-09 LAB — WET PREP, GENITAL
Clue Cells Wet Prep HPF POC: NONE SEEN
Sperm: NONE SEEN
Trich, Wet Prep: NONE SEEN
Yeast Wet Prep HPF POC: NONE SEEN

## 2019-08-09 LAB — COMPREHENSIVE METABOLIC PANEL
ALT: 18 U/L (ref 0–44)
AST: 19 U/L (ref 15–41)
Albumin: 4.2 g/dL (ref 3.5–5.0)
Alkaline Phosphatase: 74 U/L (ref 38–126)
Anion gap: 8 (ref 5–15)
BUN: 14 mg/dL (ref 6–20)
CO2: 23 mmol/L (ref 22–32)
Calcium: 9.4 mg/dL (ref 8.9–10.3)
Chloride: 106 mmol/L (ref 98–111)
Creatinine, Ser: 0.73 mg/dL (ref 0.44–1.00)
GFR calc Af Amer: >60 mL/min (ref >60)
GFR calc non Af Amer: >60 mL/min (ref >60)
Glucose, Bld: 117 mg/dL — ABNORMAL HIGH (ref 70–99)
Potassium: 4 mmol/L (ref 3.5–5.1)
Sodium: 137 mmol/L (ref 135–145)
Total Bilirubin: 0.4 mg/dL (ref 0.3–1.2)
Total Protein: 8.1 g/dL (ref 6.5–8.1)

## 2019-08-09 LAB — CBC WITH DIFFERENTIAL/PLATELET
Abs Immature Granulocytes: 0.1 10*3/uL — ABNORMAL HIGH (ref 0.00–0.07)
Basophils Absolute: 0.1 10*3/uL (ref 0.0–0.1)
Basophils Relative: 0 %
Eosinophils Absolute: 0.4 10*3/uL (ref 0.0–0.5)
Eosinophils Relative: 3 %
HCT: 37.1 % (ref 36.0–46.0)
Hemoglobin: 12 g/dL (ref 12.0–15.0)
Immature Granulocytes: 1 %
Lymphocytes Relative: 19 %
Lymphs Abs: 2.7 10*3/uL (ref 0.7–4.0)
MCH: 29.1 pg (ref 26.0–34.0)
MCHC: 32.3 g/dL (ref 30.0–36.0)
MCV: 90 fL (ref 80.0–100.0)
Monocytes Absolute: 0.8 10*3/uL (ref 0.1–1.0)
Monocytes Relative: 6 %
Neutro Abs: 10.1 10*3/uL — ABNORMAL HIGH (ref 1.7–7.7)
Neutrophils Relative %: 71 %
Platelets: 361 10*3/uL (ref 150–400)
RBC: 4.12 MIL/uL (ref 3.87–5.11)
RDW: 13.6 % (ref 11.5–15.5)
WBC: 14.2 10*3/uL — ABNORMAL HIGH (ref 4.0–10.5)
nRBC: 0 % (ref 0.0–0.2)

## 2019-08-09 LAB — POCT PREGNANCY, URINE: Preg Test, Ur: NEGATIVE

## 2019-08-09 LAB — HCG, QUANTITATIVE, PREGNANCY: hCG, Beta Chain, Quant, S: <1 m[IU]/mL (ref <5)

## 2019-08-09 LAB — LIPASE, BLOOD: Lipase: 16 U/L (ref 11–51)

## 2019-08-09 MED ORDER — DOXYCYCLINE HYCLATE 100 MG PO TABS
100.0000 mg | ORAL_TABLET | Freq: Once | ORAL | Status: AC
  Administered 2019-08-09: 100 mg via ORAL
  Filled 2019-08-09: qty 1

## 2019-08-09 MED ORDER — IOHEXOL 300 MG/ML  SOLN
100.0000 mL | Freq: Once | INTRAMUSCULAR | Status: AC | PRN
  Administered 2019-08-09: 100 mL via INTRAVENOUS

## 2019-08-09 MED ORDER — KETOROLAC TROMETHAMINE 30 MG/ML IJ SOLN
15.0000 mg | Freq: Once | INTRAMUSCULAR | Status: AC
  Administered 2019-08-09: 15 mg via INTRAVENOUS
  Filled 2019-08-09: qty 1

## 2019-08-09 MED ORDER — DEXTROSE 5 % IV SOLN
250.0000 mg | Freq: Once | INTRAVENOUS | Status: AC
  Administered 2019-08-09: 250 mg via INTRAVENOUS
  Filled 2019-08-09: qty 250

## 2019-08-09 MED ORDER — DOXYCYCLINE HYCLATE 100 MG PO CAPS: 100.0000 mg | ORAL_CAPSULE | Freq: Two times a day (BID) | ORAL | 0 refills | Status: AC

## 2019-08-09 NOTE — ED Notes (Signed)
Pt awaiting CT scan.
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
## 2019-08-09 NOTE — ED Notes (Signed)
Pt back from ultrasound.

## 2019-08-09 NOTE — ED Notes (Signed)
Pt in with generalized abd pain for 2-3 days. States has not had normal BM in over a week, denies hx of constipation. Pt also states has had decreased urination. PT denies any n.v.d or fever. Pt states it burns when she voids at times, states also concerned about STD. States has noted a fishy smell, denies any discharge.

## 2019-08-10 LAB — URINE CULTURE

## 2019-08-13 LAB — GC/CHLAMYDIA PROBE AMP
Chlamydia trachomatis, NAA: NEGATIVE
Neisseria Gonorrhoeae by PCR: POSITIVE — AB

## 2020-06-06 ENCOUNTER — Encounter: Payer: Self-pay | Admitting: Physician Assistant

## 2020-06-06 ENCOUNTER — Other Ambulatory Visit: Payer: Self-pay

## 2020-06-06 ENCOUNTER — Ambulatory Visit (LOCAL_COMMUNITY_HEALTH_CENTER): Payer: Medicaid Other | Admitting: Physician Assistant

## 2020-06-06 VITALS — BP 130/83 | Ht <= 58 in | Wt 139.0 lb

## 2020-06-06 DIAGNOSIS — Z309 Encounter for contraceptive management, unspecified: Secondary | ICD-10-CM

## 2020-06-06 DIAGNOSIS — Z3009 Encounter for other general counseling and advice on contraception: Secondary | ICD-10-CM

## 2020-06-06 DIAGNOSIS — Z113 Encounter for screening for infections with a predominantly sexual mode of transmission: Secondary | ICD-10-CM

## 2020-06-06 DIAGNOSIS — Z01419 Encounter for gynecological examination (general) (routine) without abnormal findings: Secondary | ICD-10-CM | POA: Diagnosis not present

## 2020-06-06 LAB — WET PREP FOR TRICH, YEAST, CLUE
Trichomonas Exam: NEGATIVE
Yeast Exam: NEGATIVE

## 2020-06-06 MED ORDER — THERA VITAL M PO TABS: 1.0000 | ORAL_TABLET | Freq: Every day | ORAL | 0 refills | Status: AC

## 2020-06-06 NOTE — Progress Notes (Signed)
Wet mount reviewed and is negative today, so no treatment needed for wet mount per standing order. Pt received MVI's per pt request and per provider verbal order. Pt also received information pamphlet on iron rich foods per provider verbal order. Pt received Kathreen Cosier, LCSW, and [employer] cards with contact info per provider order. Nexplanon consult completed and pt scheduled to RTC on 06/14/2020 at 9:40am for Nexplanon insertion per pt request and per provider order. Pt aware of appt date and time and to arrive early for check-in and appt card given to pt. Pt aware that from now until coming in for appt to either not have sex or use condoms every time. Pt states understanding and states she plans to abstain from sex until coming in for appt. Counseled pt per provider orders and pt states understanding. Provider orders completed.
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
## 2020-06-07 ENCOUNTER — Encounter: Payer: Self-pay | Admitting: Physician Assistant

## 2020-06-07 NOTE — Progress Notes (Signed)
[facility] DEPARTMENT [facility] [address] Main Number: [phone]    Family Planning Visit- Initial Visit  Subjective:  Jasmin Moore is a 29 y.o.  G1P1   being seen today for an initial well woman visit and to discuss family planning options.  She is currently using None for pregnancy prevention. Patient reports she does not want a pregnancy in the next year.  Patient has the following medical conditions does not have a problem list on file.  Chief Complaint  Patient presents with  . Contraception    Physical, pap and birth control    Patient reports that she would like to get on birth control but not the Depo since she was on that previously and caused "things to go wrong with my body".  States that the has a history of asthma and depression and had been on medicines but has not seen her PCP to get refills.  States that she is having breast tenderness around the time she has her period.  Reports cramping after sex last week that has now resolved.  Headaches are bitemporal and frontal, denies aura, vision changes and neuro symptoms.  States that she uses Advil and Excedrin to help relieve.  PHQ-9=12, without suicidal or homicidal ideation or plan.  Reports that her PCP referred her to a counselor but due to transportation issues she was not able to keep some of her appointments.  States that she now has reliable transportation so plans to follow up and get an appointment with the counselor again and back on her depression medicines. Thinks that she would like to get a Nexplanon as her BCM.    Patient denies any further concerns today.    Body mass index is 29.05 kg/m. - Patient is eligible for diabetes screening based on BMI and age >74?  not applicable HA1C ordered? not applicable  Patient reports 2 of partners in last year. Desires STI screening?  Yes  Has patient been screened once for HCV in the past?  No  No results found for:  HCVAB  Does the patient have current drug use (including MJ), have a partner with drug use, and/or has been incarcerated since last result? No  If yes-- Screen for HCV through [facility]   Does the patient meet criteria for HBV testing? No  Criteria:  -Household, sexual or needle sharing contact with HBV -History of drug use -HIV positive -Those with known Hep C   Health Maintenance Due  Topic Date Due  . Hepatitis C Screening  Never done  . COVID-19 Vaccine (1) Never done  . PAP SMEAR-Modifier  Never done  . INFLUENZA VACCINE  05/20/2020    Review of Systems  All other systems reviewed and are negative.   The following portions of the patient's history were reviewed and updated as appropriate: allergies, current medications, past family history, past medical history, past social history, past surgical history and problem list. Problem list updated.   See flowsheet for other program required questions.  Objective:   Vitals:   06/06/20 0928  BP: 130/83  Weight: 139 lb (63 kg)  Height: 4\' 10"  (1.473 m)    Physical Exam Vitals and nursing note reviewed.  Constitutional:      General: She is not in acute distress.    Appearance: Normal appearance.  HENT:     Head: Normocephalic and atraumatic.  Eyes:     Conjunctiva/sclera: Conjunctivae normal.  Neck:  Thyroid: No thyroid mass, thyromegaly or thyroid tenderness.  Cardiovascular:     Rate and Rhythm: Normal rate and regular rhythm.  Pulmonary:     Effort: Pulmonary effort is normal.     Breath sounds: Normal breath sounds.  Chest:     Breasts:        Right: Normal. No mass, nipple discharge, skin change or tenderness.        Left: Normal. No mass, nipple discharge, skin change or tenderness.  Abdominal:     Palpations: Abdomen is soft. There is no mass.     Tenderness: There is no abdominal tenderness. There is no guarding or rebound.  Genitourinary:    General: Normal vulva.     Rectum: Normal.      Comments: External genitalia/pubic area without nits, lice, edema, erythema, lesions and inguinal adenopathy. Vagina with normal mucosa and discharge, Cervix without visible lesions. Uterus firm, mobile, nt, no masses, no CMT, no adnexal tenderness or fullness. Musculoskeletal:     Cervical back: Neck supple. No tenderness.  Lymphadenopathy:     Cervical: No cervical adenopathy.     Upper Body:     Right upper body: No supraclavicular, axillary or pectoral adenopathy.     Left upper body: No supraclavicular, axillary or pectoral adenopathy.  Skin:    General: Skin is warm and dry.     Findings: No bruising, erythema, lesion or rash.  Neurological:     Mental Status: She is alert and oriented to person, place, and time.  Psychiatric:        Mood and Affect: Mood normal.        Behavior: Behavior normal.        Thought Content: Thought content normal.        Judgment: Judgment normal.       Assessment and Plan:  Jasmin Moore is a 29 y.o. female presenting to the [facility] Department for an initial well woman exam/family planning visit  Contraception counseling: Reviewed all forms of birth control options in the tiered based approach. available including abstinence; over the counter/barrier methods; hormonal contraceptive medication including pill, patch, ring, injection,contraceptive implant, ECP; hormonal and nonhormonal IUDs; permanent sterilization options including vasectomy and the various tubal sterilization modalities. Risks, benefits, and typical effectiveness rates were reviewed.  Questions were answered.  Written information was also given to the patient to review.  Patient desires Nexplanon insertion.Marland Kitchen She will follow up in  1 week for insertion, 1 year for RP and prn for surveillance.  She was told to call with any further questions, or with any concerns about this method of contraception.  Emphasized use of condoms 100% of the time for STI  prevention.  Patient was not a candidate for ECP today.   1. Encounter for counseling regarding contraception Counseled re:  Above for BCMs and specifically re:  Nexplanon. Counseled patient that due to unprotected sex last week we are not able to do insertion today, but that we can schedule for insertion in about 1 week. Rec to abstain or use condoms with all sex until 10 days after insertion. Enc MVI 1 po daily.  - Multiple Vitamins-Minerals (MULTIVITAMIN) tablet; Take 1 tablet by mouth daily.  Dispense: 100 tablet; Refill: 0  2. Screening for STD (sexually transmitted disease) Await test results.  Counseled that RN will call if needs to RTC for treatment once results are back.  - WET PREP FOR TRICH, YEAST, CLUE - Chlamydia/Gonorrhea [facility] - Syphilis  Serology, [facility] - HIV/HCV  Lab  3. Well woman exam with routine gynecological exam Reviewed with patient healthy habits to maintain normal BMI and general health. Enc to follow up with PCP for chronic conditions and medications for asthma and depression and evaluation for headaches. Await pap results and counseled that RN will send letter or call with results and follow up plan.  Offer LCSW and [employer] cards for depression if not able to get in touch with previous counselor. - IGP, [employer] HPV     Return in 8 days (on 06/14/2020) for Nexplanon insertion, 2-3 weeks for TR's, annual and PRN.  Future Appointments  Date Time Provider Department Center  06/14/2020  9:40 AM [facility] PROVIDER [facility] None    Matt Holmes, PA

## 2020-06-09 LAB — IGP, APTIMA HPV
HPV Aptima: NEGATIVE
PAP Smear Comment: 0

## 2020-06-11 ENCOUNTER — Encounter: Payer: Self-pay | Admitting: Family Medicine

## 2020-06-11 LAB — HM HIV SCREENING LAB: HM HIV Screening: NEGATIVE

## 2020-06-11 LAB — HM HEPATITIS C SCREENING LAB: HM Hepatitis Screen: NEGATIVE

## 2020-06-14 ENCOUNTER — Ambulatory Visit: Payer: Self-pay

## 2020-08-21 ENCOUNTER — Telehealth: Payer: Self-pay | Admitting: Family Medicine

## 2020-08-21 NOTE — Telephone Encounter (Signed)
Call to patient, receiving message " this number can not be completed as dialed."   Harvie Heck, RN

## 2020-08-21 NOTE — Telephone Encounter (Signed)
Pt wants results from last visit.

## 2020-12-31 ENCOUNTER — Other Ambulatory Visit: Payer: Medicaid Other

## 2021-03-22 ENCOUNTER — Other Ambulatory Visit: Payer: Medicaid Other

## 2021-07-26 IMAGING — US US PELVIS COMPLETE TRANSABD/TRANSVAG W DUPLEX
1 series · 13 of 25 positions shown · non-contrast
Comparison: Pelvic ultrasound 05/05/2014

CLINICAL DATA: Suprapubic abdominal pain.  Pain for 2 days.

EXAM:
TRANSABDOMINAL AND TRANSVAGINAL ULTRASOUND OF PELVIS
DOPPLER ULTRASOUND OF OVARIES
TECHNIQUE: Both transabdominal and transvaginal ultrasound examinations of the
pelvis were performed. Transabdominal technique was performed for
global imaging of the pelvis including uterus, ovaries, adnexal
regions, and pelvic cul-de-sac.
It was necessary to proceed with endovaginal exam following the
transabdominal exam to visualize the uterus and adnexa. Color and
duplex Doppler ultrasound was utilized to evaluate blood flow to the
ovaries.

[Series 1: us pelvis complete transabd/transvag w duplex · 13 of 96 slices shown]
[im 1/96]
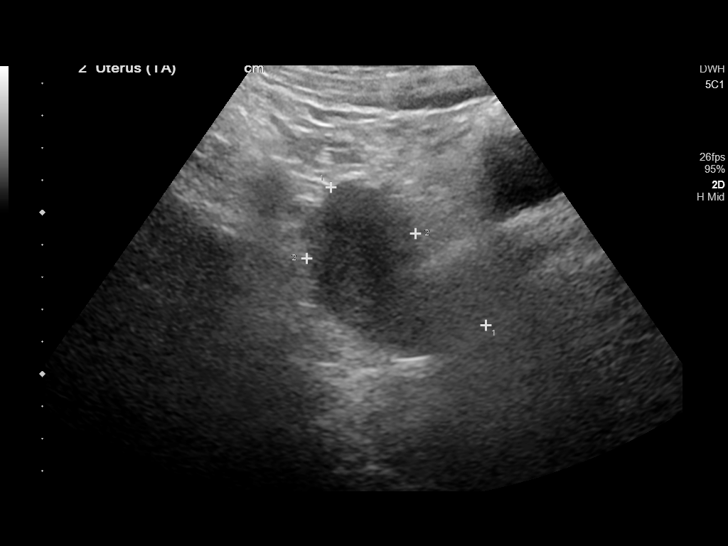
[im 8/96]
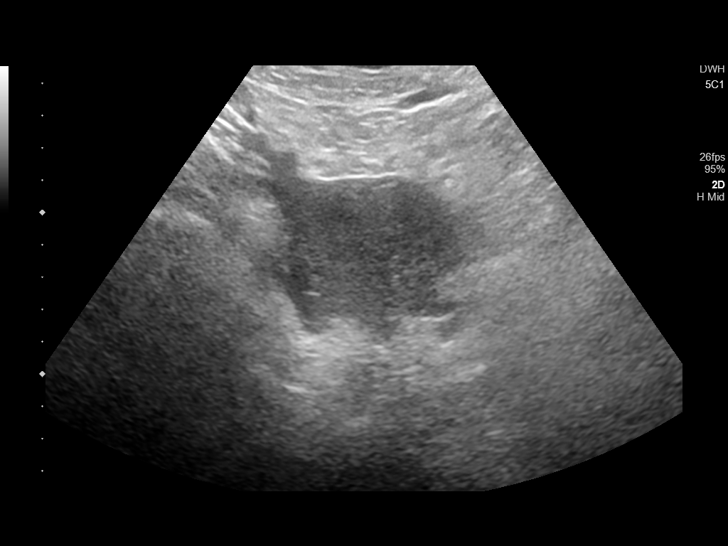
[im 16/96]
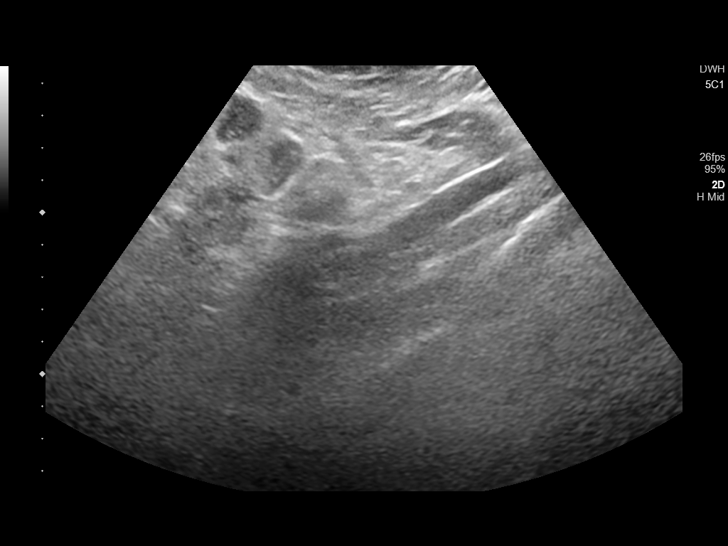
[im 24/96]
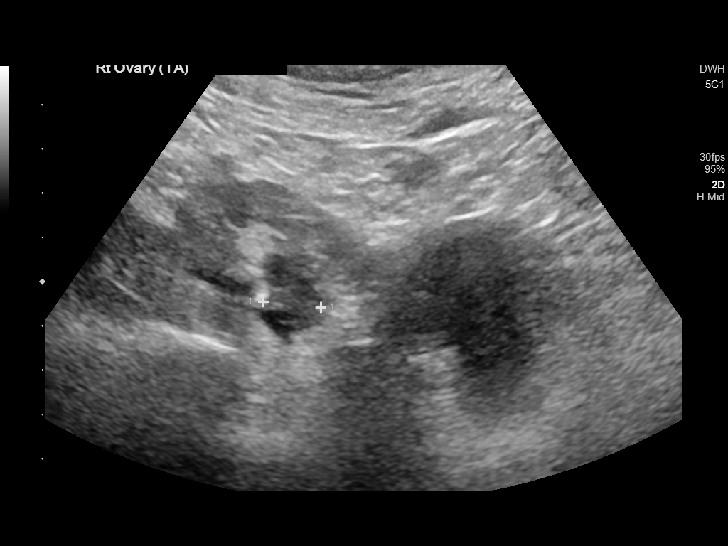
[im 32/96]
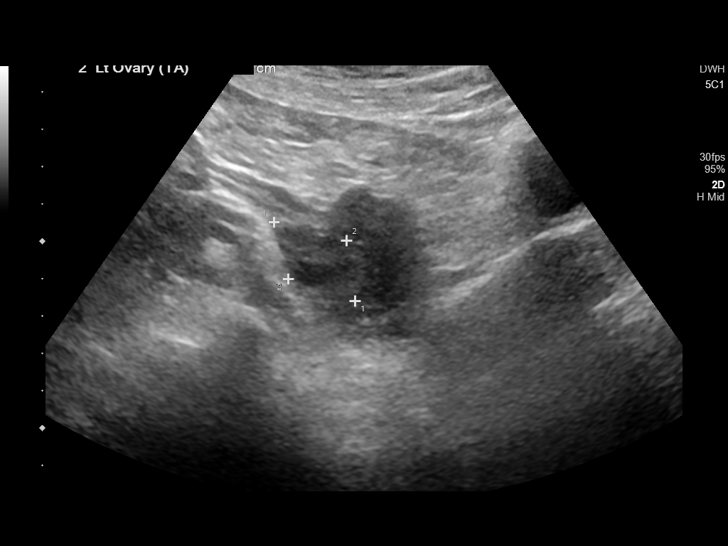
[im 40/96]
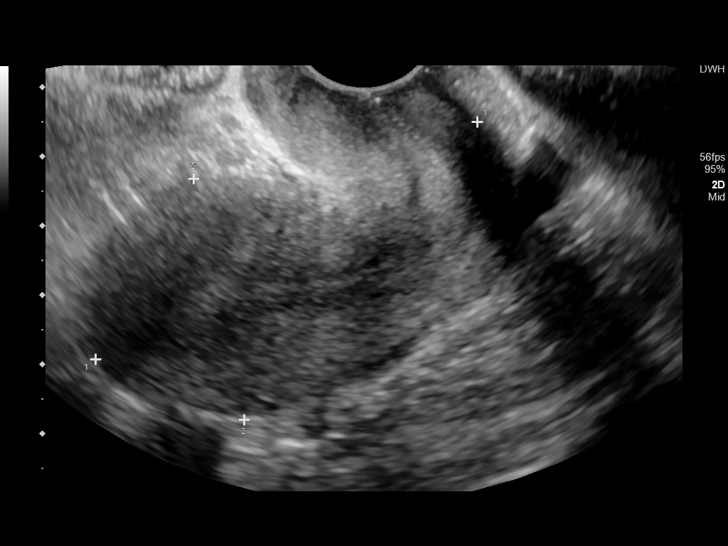
[im 48/96]
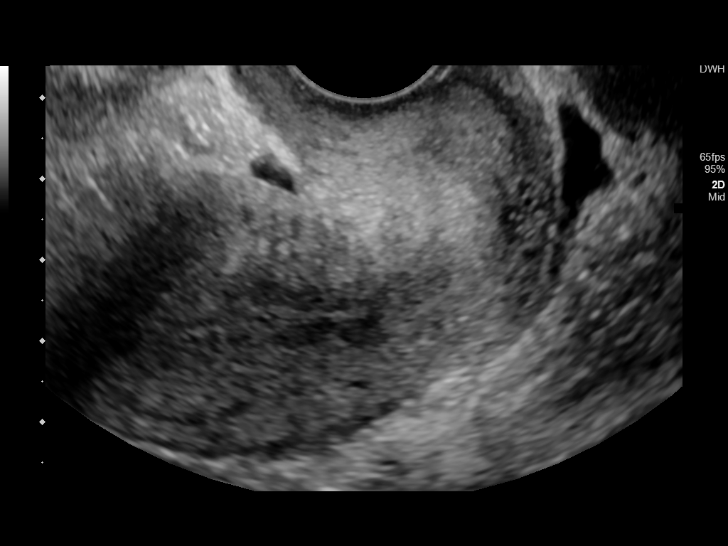
[im 56/96]
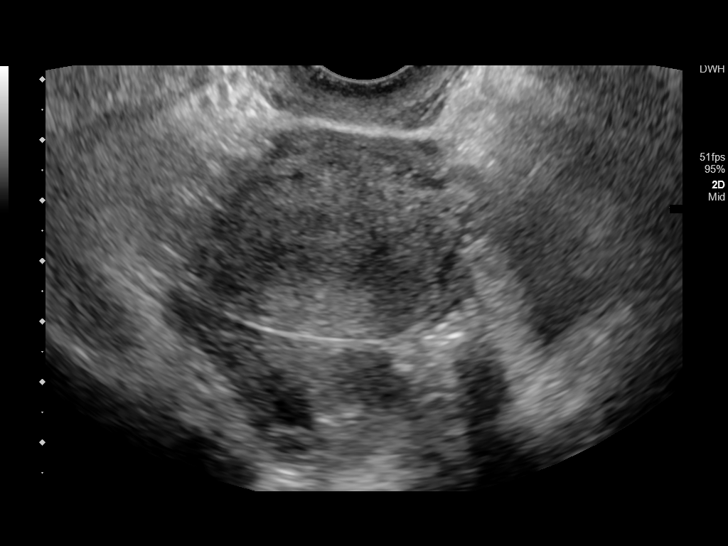
[im 64/96]
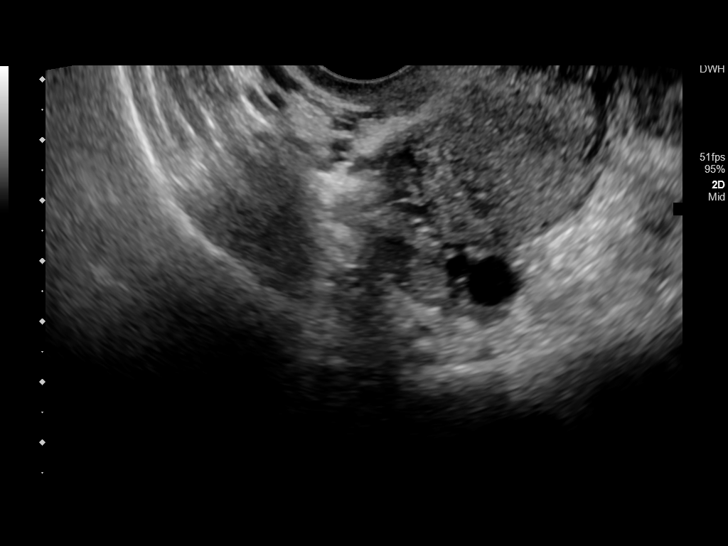
[im 72/96]
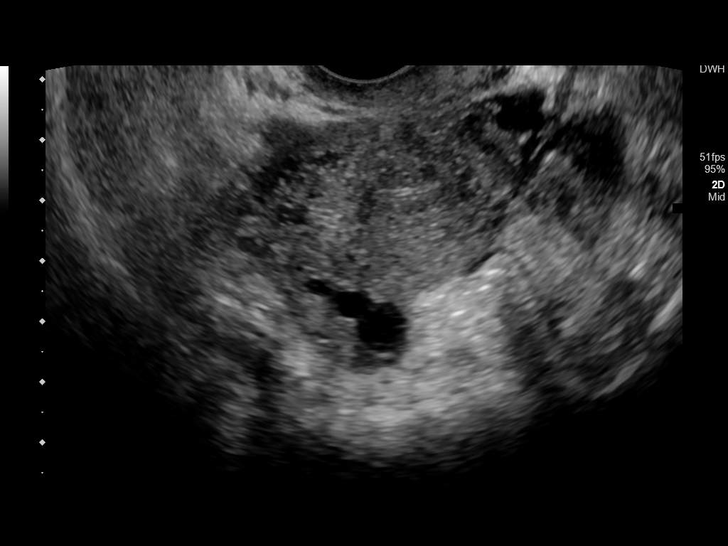
[im 80/96]
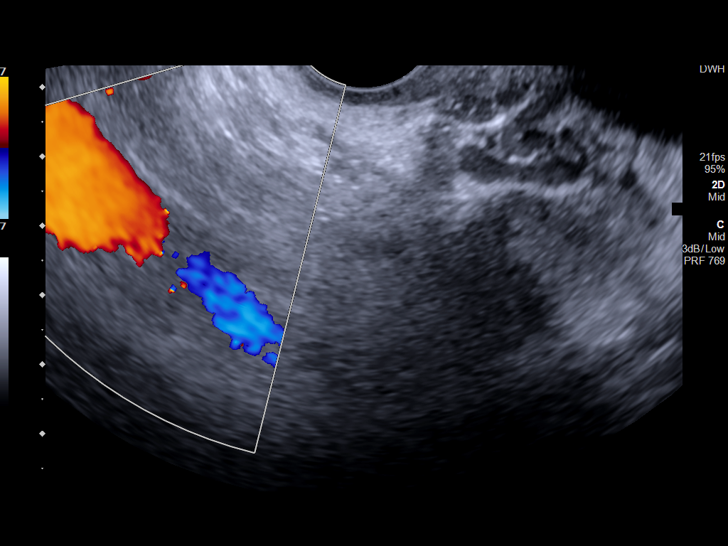
[im 88/96]
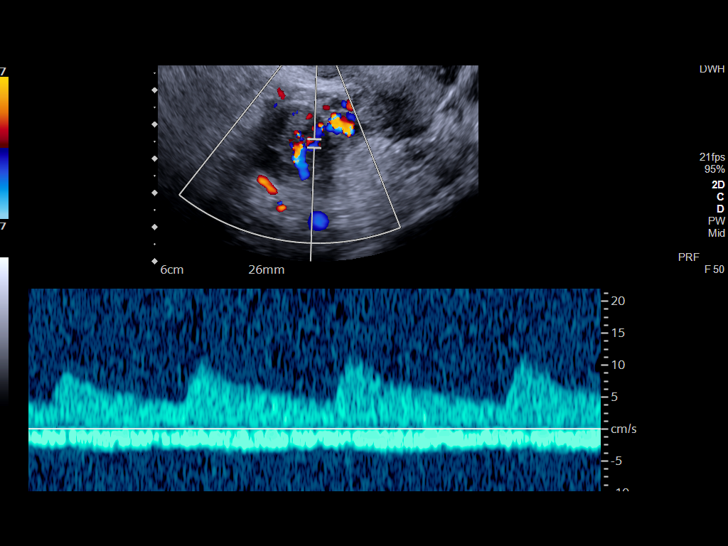
[im 96/96]
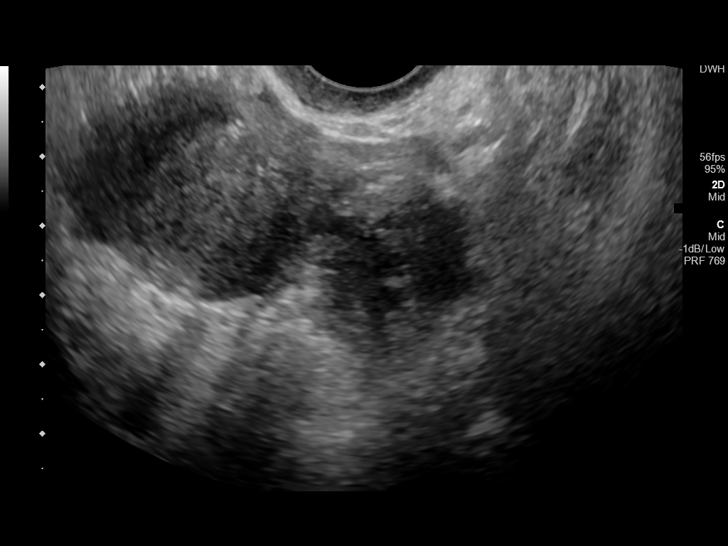

[13 of 25 positions shown; findings below may reference images not displayed]

FINDINGS: Uterus

Measurements: 6.5 x 3.6 x 4.7 cm = volume: 57 mL. The uterus is
anteverted. No fibroids or other mass visualized.

Endometrium

Thickness: 7 mm.  No focal abnormality visualized.

Right ovary

Measurements: 8.9 x 2.2 x 2.7 cm = volume: 12.3 mL. Normal
appearance with physiologic follicles. Normal blood flow. No adnexal
mass.

Left ovary

Measurements: 3.3 x 2.4 x 2.1 cm = volume: 8.6 mL. Normal appearance
with blood flow. No adnexal mass.

Pulsed Doppler evaluation of both ovaries demonstrates normal
low-resistance arterial and venous waveforms.

Other findings

Small volume free fluid in the cul-de-sac, likely physiologic.
IMPRESSION: Normal pelvic ultrasound with Doppler.

## 2021-08-16 ENCOUNTER — Other Ambulatory Visit: Payer: Medicaid Other

## 2021-12-03 ENCOUNTER — Ambulatory Visit (LOCAL_COMMUNITY_HEALTH_CENTER): Payer: Self-pay

## 2021-12-03 ENCOUNTER — Other Ambulatory Visit: Payer: Self-pay

## 2021-12-03 DIAGNOSIS — Z111 Encounter for screening for respiratory tuberculosis: Secondary | ICD-10-CM

## 2021-12-06 ENCOUNTER — Ambulatory Visit (LOCAL_COMMUNITY_HEALTH_CENTER): Payer: Medicaid Other

## 2021-12-06 ENCOUNTER — Other Ambulatory Visit: Payer: Self-pay

## 2021-12-06 DIAGNOSIS — Z111 Encounter for screening for respiratory tuberculosis: Secondary | ICD-10-CM

## 2021-12-06 LAB — TB SKIN TEST
Induration: 0 mm
TB Skin Test: NEGATIVE

## 2024-07-22 ENCOUNTER — Other Ambulatory Visit
